# Patient Record
Sex: Female | Born: 1965 | Race: White | Hispanic: No | Marital: Married | Smoking: Never smoker
Health system: Southern US, Community
[De-identification: ages and names within clinical notes are randomized; demographics above are authoritative.]

## PROBLEM LIST (undated history)

## (undated) DIAGNOSIS — F419 Anxiety disorder, unspecified: Secondary | ICD-10-CM

## (undated) DIAGNOSIS — B009 Herpesviral infection, unspecified: Secondary | ICD-10-CM

## (undated) DIAGNOSIS — J302 Other seasonal allergic rhinitis: Secondary | ICD-10-CM

## (undated) DIAGNOSIS — A609 Anogenital herpesviral infection, unspecified: Secondary | ICD-10-CM

## (undated) DIAGNOSIS — E669 Obesity, unspecified: Secondary | ICD-10-CM

## (undated) HISTORY — PX: NASAL SEPTUM SURGERY: SHX37

## (undated) HISTORY — PX: CHOLECYSTECTOMY: SHX55

## (undated) HISTORY — PX: HAMMER TOE SURGERY: SHX385

## (undated) HISTORY — DX: Obesity, unspecified: E66.9

---

## 1999-02-22 ENCOUNTER — Other Ambulatory Visit: Admission: RE | Admit: 1999-02-22 | Discharge: 1999-02-22 | Payer: Self-pay | Admitting: Obstetrics and Gynecology

## 1999-03-30 ENCOUNTER — Other Ambulatory Visit: Admission: RE | Admit: 1999-03-30 | Discharge: 1999-03-30 | Payer: Self-pay | Admitting: *Deleted

## 1999-04-13 ENCOUNTER — Other Ambulatory Visit: Admission: RE | Admit: 1999-04-13 | Discharge: 1999-04-13 | Payer: Self-pay | Admitting: *Deleted

## 1999-12-13 ENCOUNTER — Other Ambulatory Visit: Admission: RE | Admit: 1999-12-13 | Discharge: 1999-12-13 | Payer: Self-pay | Admitting: *Deleted

## 2001-04-30 ENCOUNTER — Other Ambulatory Visit: Admission: RE | Admit: 2001-04-30 | Discharge: 2001-04-30 | Payer: Self-pay | Admitting: *Deleted

## 2012-02-13 ENCOUNTER — Observation Stay (HOSPITAL_BASED_OUTPATIENT_CLINIC_OR_DEPARTMENT_OTHER)
Admission: EM | Admit: 2012-02-13 | Discharge: 2012-02-14 | Disposition: A | Discharge status: Home / Self Care | Payer: BC Managed Care – PPO | Attending: Internal Medicine | Admitting: Internal Medicine

## 2012-02-13 ENCOUNTER — Other Ambulatory Visit: Payer: Self-pay

## 2012-02-13 ENCOUNTER — Encounter (HOSPITAL_BASED_OUTPATIENT_CLINIC_OR_DEPARTMENT_OTHER): Payer: Self-pay | Admitting: *Deleted

## 2012-02-13 ENCOUNTER — Emergency Department (INDEPENDENT_AMBULATORY_CARE_PROVIDER_SITE_OTHER): Payer: BC Managed Care – PPO

## 2012-02-13 DIAGNOSIS — G479 Sleep disorder, unspecified: Secondary | ICD-10-CM | POA: Insufficient documentation

## 2012-02-13 DIAGNOSIS — J302 Other seasonal allergic rhinitis: Secondary | ICD-10-CM

## 2012-02-13 DIAGNOSIS — R197 Diarrhea, unspecified: Secondary | ICD-10-CM | POA: Diagnosis present

## 2012-02-13 DIAGNOSIS — A609 Anogenital herpesviral infection, unspecified: Secondary | ICD-10-CM

## 2012-02-13 DIAGNOSIS — R079 Chest pain, unspecified: Principal | ICD-10-CM

## 2012-02-13 DIAGNOSIS — F419 Anxiety disorder, unspecified: Secondary | ICD-10-CM | POA: Diagnosis present

## 2012-02-13 DIAGNOSIS — R110 Nausea: Secondary | ICD-10-CM | POA: Insufficient documentation

## 2012-02-13 DIAGNOSIS — F411 Generalized anxiety disorder: Secondary | ICD-10-CM | POA: Insufficient documentation

## 2012-02-13 DIAGNOSIS — R61 Generalized hyperhidrosis: Secondary | ICD-10-CM | POA: Insufficient documentation

## 2012-02-13 DIAGNOSIS — D72829 Elevated white blood cell count, unspecified: Secondary | ICD-10-CM | POA: Diagnosis present

## 2012-02-13 DIAGNOSIS — R9431 Abnormal electrocardiogram [ECG] [EKG]: Secondary | ICD-10-CM

## 2012-02-13 DIAGNOSIS — J9819 Other pulmonary collapse: Secondary | ICD-10-CM

## 2012-02-13 HISTORY — DX: Anogenital herpesviral infection, unspecified: A60.9

## 2012-02-13 HISTORY — DX: Herpesviral infection, unspecified: B00.9

## 2012-02-13 HISTORY — DX: Other seasonal allergic rhinitis: J30.2

## 2012-02-13 HISTORY — DX: Anxiety disorder, unspecified: F41.9

## 2012-02-13 LAB — CBC
HCT: 39.8 % (ref 36.0–46.0)
Hemoglobin: 15.3 g/dL — ABNORMAL HIGH (ref 12.0–15.0)
Hemoglobin: 14.1 g/dL (ref 12.0–15.0)
MCH: 33.1 pg (ref 26.0–34.0)
MCHC: 35.4 g/dL (ref 30.0–36.0)
Platelets: 262 10*3/uL (ref 150–400)
RBC: 4.62 MIL/uL (ref 3.87–5.11)
RBC: 4.24 MIL/uL (ref 3.87–5.11)
WBC: 11.9 10*3/uL — ABNORMAL HIGH (ref 4.0–10.5)
WBC: 8.3 10*3/uL (ref 4.0–10.5)

## 2012-02-13 LAB — HEPATIC FUNCTION PANEL
AST: 20 U/L (ref 0–37)
Albumin: 3.4 g/dL — ABNORMAL LOW (ref 3.5–5.2)
Total Protein: 6.8 g/dL (ref 6.0–8.3)

## 2012-02-13 LAB — LIPASE, BLOOD: Lipase: 17 U/L (ref 11–59)

## 2012-02-13 LAB — BASIC METABOLIC PANEL
Chloride: 103 mEq/L (ref 96–112)
GFR calc Af Amer: >90 mL/min (ref >90)
GFR calc non Af Amer: >90 mL/min (ref >90)
Glucose, Bld: 143 mg/dL — ABNORMAL HIGH (ref 70–99)
Potassium: 3.9 mEq/L (ref 3.5–5.1)
Sodium: 138 mEq/L (ref 135–145)

## 2012-02-13 LAB — URINALYSIS, ROUTINE W REFLEX MICROSCOPIC
Ketones, ur: NEGATIVE mg/dL
Leukocytes, UA: NEGATIVE
Protein, ur: NEGATIVE mg/dL
Urobilinogen, UA: 1 mg/dL (ref 0.0–1.0)

## 2012-02-13 LAB — PROTIME-INR
INR: 0.99 (ref 0.00–1.49)
Prothrombin Time: 13.3 seconds (ref 11.6–15.2)

## 2012-02-13 LAB — DIFFERENTIAL
Eosinophils Absolute: 0.1 10*3/uL (ref 0.0–0.7)
Lymphs Abs: 0.6 10*3/uL — ABNORMAL LOW (ref 0.7–4.0)
Neutro Abs: 10.4 10*3/uL — ABNORMAL HIGH (ref 1.7–7.7)
Neutrophils Relative %: 87 % — ABNORMAL HIGH (ref 43–77)

## 2012-02-13 LAB — APTT: aPTT: 27 seconds (ref 24–37)

## 2012-02-13 LAB — CARDIAC PANEL(CRET KIN+CKTOT+MB+TROPI)
CK, MB: 1.4 ng/mL (ref 0.3–4.0)
CK, MB: 1.2 ng/mL (ref 0.3–4.0)
Relative Index: INVALID (ref 0.0–2.5)
Total CK: 32 U/L (ref 7–177)
Troponin I: <0.3 ng/mL (ref <0.30)
Troponin I: <0.3 ng/mL (ref <0.30)

## 2012-02-13 LAB — URINE MICROSCOPIC-ADD ON

## 2012-02-13 LAB — CREATININE, SERUM
GFR calc Af Amer: >90 mL/min (ref >90)
GFR calc non Af Amer: >90 mL/min (ref >90)

## 2012-02-13 LAB — TSH: TSH: 2.006 u[IU]/mL (ref 0.350–4.500)

## 2012-02-13 MED ORDER — NITROGLYCERIN 0.3 MG SL SUBL
0.3000 mg | SUBLINGUAL_TABLET | SUBLINGUAL | Status: DC | PRN
  Filled 2012-02-13: qty 100

## 2012-02-13 MED ORDER — SODIUM CHLORIDE 0.9 % IJ SOLN: 3.0000 mL | INTRAMUSCULAR | Status: DC | PRN

## 2012-02-13 MED ORDER — ENOXAPARIN SODIUM 40 MG/0.4ML ~~LOC~~ SOLN
40.0000 mg | SUBCUTANEOUS | Status: DC
  Administered 2012-02-13: 40 mg via SUBCUTANEOUS
  Filled 2012-02-13 (×2): qty 0.4

## 2012-02-13 MED ORDER — SODIUM CHLORIDE 0.9 % IJ SOLN
3.0000 mL | Freq: Two times a day (BID) | INTRAMUSCULAR | Status: DC
  Administered 2012-02-14: 3 mL via INTRAVENOUS

## 2012-02-13 MED ORDER — POLYVINYL ALCOHOL 1.4 % OP SOLN
1.0000 [drp] | Freq: Every day | OPHTHALMIC | Status: DC | PRN
  Filled 2012-02-13: qty 15

## 2012-02-13 MED ORDER — METOPROLOL TARTRATE 12.5 MG HALF TABLET
12.5000 mg | ORAL_TABLET | Freq: Two times a day (BID) | ORAL | Status: DC
  Administered 2012-02-13 – 2012-02-14 (×3): 12.5 mg via ORAL
  Filled 2012-02-13 (×4): qty 1

## 2012-02-13 MED ORDER — LORAZEPAM 1 MG PO TABS
1.0000 mg | ORAL_TABLET | Freq: Once | ORAL | Status: AC
  Administered 2012-02-13: 1 mg via ORAL
  Filled 2012-02-13: qty 1

## 2012-02-13 MED ORDER — SODIUM CHLORIDE 0.9 % IJ SOLN
3.0000 mL | Freq: Two times a day (BID) | INTRAMUSCULAR | Status: DC
  Administered 2012-02-13: 3 mL via INTRAVENOUS

## 2012-02-13 MED ORDER — ASPIRIN EC 325 MG PO TBEC
325.0000 mg | DELAYED_RELEASE_TABLET | Freq: Every day | ORAL | Status: DC
  Administered 2012-02-14: 325 mg via ORAL
  Filled 2012-02-13 (×2): qty 1

## 2012-02-13 MED ORDER — ALUM & MAG HYDROXIDE-SIMETH 200-200-20 MG/5ML PO SUSP: 30.0000 mL | Freq: Four times a day (QID) | ORAL | Status: DC | PRN

## 2012-02-13 MED ORDER — ACETAMINOPHEN 325 MG PO TABS
650.0000 mg | ORAL_TABLET | Freq: Four times a day (QID) | ORAL | Status: DC | PRN
  Administered 2012-02-14: 650 mg via ORAL
  Filled 2012-02-13: qty 2

## 2012-02-13 MED ORDER — LORAZEPAM 1 MG PO TABS: 1.0000 mg | ORAL_TABLET | Freq: Three times a day (TID) | ORAL | Status: DC | PRN

## 2012-02-13 MED ORDER — ASPIRIN 81 MG PO CHEW
324.0000 mg | CHEWABLE_TABLET | Freq: Once | ORAL | Status: AC
  Administered 2012-02-13: 324 mg via ORAL
  Filled 2012-02-13: qty 4

## 2012-02-13 MED ORDER — LORATADINE 10 MG PO TABS
10.0000 mg | ORAL_TABLET | Freq: Every day | ORAL | Status: DC
  Administered 2012-02-13 – 2012-02-14 (×2): 10 mg via ORAL
  Filled 2012-02-13 (×2): qty 1

## 2012-02-13 MED ORDER — ONDANSETRON HCL 4 MG/2ML IJ SOLN: 4.0000 mg | Freq: Four times a day (QID) | INTRAMUSCULAR | Status: DC | PRN

## 2012-02-13 MED ORDER — ONDANSETRON HCL 4 MG PO TABS
4.0000 mg | ORAL_TABLET | Freq: Four times a day (QID) | ORAL | Status: DC | PRN
  Administered 2012-02-13: 4 mg via ORAL
  Filled 2012-02-13: qty 1

## 2012-02-13 MED ORDER — OXYCODONE HCL 5 MG PO TABS: 5.0000 mg | ORAL_TABLET | ORAL | Status: DC | PRN

## 2012-02-13 MED ORDER — ONDANSETRON HCL 4 MG/2ML IJ SOLN
INTRAMUSCULAR | Status: AC
  Administered 2012-02-13: 4 mg
  Filled 2012-02-13: qty 2

## 2012-02-13 MED ORDER — ACETAMINOPHEN 650 MG RE SUPP: 650.0000 mg | Freq: Four times a day (QID) | RECTAL | Status: DC | PRN

## 2012-02-13 MED ORDER — POLYETHYL GLYCOL-PROPYL GLYCOL 0.4-0.3 % OP SOLN: Freq: Every day | OPHTHALMIC | Status: DC | PRN

## 2012-02-13 MED ORDER — PANTOPRAZOLE SODIUM 40 MG PO TBEC
40.0000 mg | DELAYED_RELEASE_TABLET | Freq: Every day | ORAL | Status: DC
  Administered 2012-02-13 – 2012-02-14 (×2): 40 mg via ORAL
  Filled 2012-02-13 (×2): qty 1

## 2012-02-13 MED ORDER — SODIUM CHLORIDE 0.9 % IV SOLN: 250.0000 mL | INTRAVENOUS | Status: DC | PRN

## 2012-02-13 MED ORDER — GI COCKTAIL ~~LOC~~
30.0000 mL | Freq: Three times a day (TID) | ORAL | Status: DC | PRN
  Filled 2012-02-13: qty 30

## 2012-02-13 MED ORDER — ACYCLOVIR 400 MG PO TABS
400.0000 mg | ORAL_TABLET | ORAL | Status: DC
  Filled 2012-02-13 (×9): qty 1

## 2012-02-13 MED ORDER — FLUTICASONE PROPIONATE 50 MCG/ACT NA SUSP
2.0000 | Freq: Every day | NASAL | Status: DC
  Administered 2012-02-14: 2 via NASAL
  Filled 2012-02-13: qty 16

## 2012-02-13 NOTE — ED Notes (Signed)
Admission bed received, pt informed.

## 2012-02-13 NOTE — ED Notes (Signed)
Previous weight documented incorrectly.  Pt weighs 239 lb.

## 2012-02-13 NOTE — H&P (Signed)
Pamela Foley MRN: [id number] DOB/AGE: 45-11-1965 45 y.o. Primary Care Physician:AGUIAR,RAFAELA M., MD, MD Admit date: 02/13/2012 Chief Complaint: Chest pain/panic attacks. HPI:  Pamela Foley is a 46 year old occasional female history of anxiety, genital itches, status post cholecystectomy who presented to the metastases point will she'll chest pain and panic attacks. Patient states that one day prior to admission she had 3 panic attacks that they. Patient does endorse some diaphoresis and palpitations. Patient subsequently did have some midsternal chest pain which he describes as a bowel in nature with some epigastric and left shoulder pain. Patient stated that adjustment of the bowel sensation is nonradiating with some associated palpitations increased anxiety and nausea and weakness. Patient denies any shortness of breath he denies any proximal dyspnea patient denies any fever no chills no constipation no other associated symptoms. Patient does endorse some mild diarrhea for the night prior to admission and states that 2 weeks prior to admission had been on antibiotics for a sinus infection. Patient was seen in the ED had metastases [place] EKG which was done did show some Q waves. Urine pregnancy study which was done was negative. CBC obtained had a white count of 11.9 otherwise was unremarkable. Basic metabolic profile which was done was unremarkable except a glucose of 143. EKG which was done did have some Q waves. Patient was given some aspirin as well as some nylon in the ED for. We were called to admit the patient for further evaluation and management.  Past Medical History  Diagnosis Date  . Herpes   . Anxiety 02/13/2012  . Seasonal allergies 02/13/2012  . HSV (herpes simplex virus) anogenital infection 02/13/2012    Past Surgical History  Procedure Date  . Cholecystectomy   . Nasal septum surgery   . Hammer toe surgery     Prior to Admission medications   Medication Sig  Start Date End Date Taking? Authorizing Provider  acyclovir (ZOVIRAX) 400 MG tablet Take 400 mg by mouth every 4 (four) hours while awake.   Yes Historical Provider, MD  ALPRAZolam Prudy Feeler) 0.5 MG tablet Take 0.5 mg by mouth 3 (three) times daily as needed. For anxiety   Yes Historical Provider, MD  cetirizine (ZYRTEC) 10 MG tablet Take 10 mg by mouth daily.   Yes Historical Provider, MD  fluticasone (FLONASE) 50 MCG/ACT nasal spray Place 2 sprays into the nose daily.   Yes Historical Provider, MD  ibuprofen (ADVIL,MOTRIN) 200 MG tablet Take 200 mg by mouth every 6 (six) hours as needed. For headache   Yes Historical Provider, MD  Polyethyl Glycol-Propyl Glycol (SYSTANE OP) Apply 1 drop to eye daily as needed. For dry eyes   Yes Historical Provider, MD    Allergies:  Allergies  Allergen Reactions  . Morphine And Related Nausea And Vomiting and Nausea Only    Family History  Problem Relation Age of Onset  . Heart attack Father     Social History:  reports that she has never smoked. She does not have any smokeless tobacco history on file. She reports that she drinks alcohol. She reports that she does not use illicit drugs.  ROS: All systems reviewed with the patient and was positive as per HPI otherwise all other systems are negative.  PHYSICAL EXAM: Blood pressure 141/86, pulse 85, temperature 98 F (36.7 C), temperature source Oral, resp. rate 20, height 5\' 3"  (1.6 m), weight 108.5 kg (239 lb 3.2 oz), last menstrual period 01/09/2012, SpO2 96.00%. General: Well-developed well-nourished in no acute  cardiopulmonary distress.  HEENT: Normocephalic atraumatic. Pupils equal regular reactive to light and accommodation. Extraocular movements intact. Oropharynx is clear, no lesions no exudates. Neck is supple with no lymphadenopathy. No bruits, no goiter. Heart: Regular rate and rhythm, without murmurs, rubs, gallops. Lungs: Clear to auscultation bilaterally. Abdomen: Soft, nontender,  nondistended, positive bowel sounds. Extremities: No clubbing cyanosis or edema with positive pedal pulses. Neuro: Alert and oriented x3. Creatinine is 2 through 12 are grossly intact. Sensation is intact. Power 5 bilateral upper extremity strength. However 5 bilateral lower extremity strength. Visual fields are intact. Gait not tested secondary to safety.   EKG: Q waves in leads 1 and 2.  No results found for this or any previous visit (from the past 240 hour(s)).   Lab results:  [facility] 02/13/12 0619  NA 138  K 3.9  CL 103  CO2 24  GLUCOSE 143*  BUN 13  CREATININE 0.70  CALCIUM 9.1  MG --  PHOS --   No results found for this basename: AST:2,ALT:2,ALKPHOS:2,BILITOT:2,PROT:2,ALBUMIN:2 in the last 72 hours No results found for this basename: LIPASE:2,AMYLASE:2 in the last 72 hours  Basename 02/13/12 0619  WBC 11.9*  NEUTROABS 10.4*  HGB 15.3*  HCT 43.0  MCV 93.1  PLT 262    Basename 02/13/12 0619  CKTOTAL 40  CKMB 1.4  CKMBINDEX --  TROPONINI <0.30   No components found with this basename: POCBNP:3 No results found for this basename: DDIMER in the last 72 hours No results found for this basename: HGBA1C:2 in the last 72 hours No results found for this basename: CHOL:2,HDL:2,LDLCALC:2,TRIG:2,CHOLHDL:2,LDLDIRECT:2 in the last 72 hours No results found for this basename: TSH,T4TOTAL,FREET3,T3FREE,THYROIDAB in the last 72 hours No results found for this basename: VITAMINB12:2,FOLATE:2,FERRITIN:2,TIBC:2,IRON:2,RETICCTPCT:2 in the last 72 hours Imaging results:  Dg Chest 2 View  02/13/2012  *RADIOLOGY REPORT*  Clinical Data: Anxiety.  CHEST - 2 VIEW  Comparison: None.  Findings: The lungs are well-aerated.  Minimal right basilar atelectasis is noted.  There is no evidence of pleural effusion or pneumothorax.  The heart is normal in size; the mediastinal contour is within normal limits.  No acute osseous abnormalities are seen.  IMPRESSION: Minimal right basilar atelectasis;  lungs otherwise clear.  Original Report Authenticated By: Tonia Ghent, M.D.   Impression/Plan:  Principal Problem:  *Chest pain Active Problems:  Anxiety  Seasonal allergies  HSV (herpes simplex virus) anogenital infection  Diarrhea  Leukocytosis   #1 chest pain Patient does have a family history of premature coronary artery disease when she states her father died at age 16s from the massive MI otherwise no other significant risk factors. Patient's chest pain started around the time she was having panic attacks. Will admit the patient to telemetry for observation. We'll cycle cardiac enzymes every 8 hours x3. Will check a fasting lipid panel, TSH, lipase. Will check a 2-D echo. We'll place on aspirin, oxygen, nitroglycerin as needed, low dose beta blocker, PPI and a GI cocktail as needed, Ativan when necessary.Maryclare Labrador need to get old EKG from patient's PCPs office.  #2 anxiety Patient is likely anxious. See problem #1. We'll place on Ativan as needed.  #3 seasonal allergies Claritin.  #4 HSV Resume home dose acyclovir.  #5 leukocytosis  chest x-ray is negative. Will check a UA with cultures and sensitivities. On the phone about a such this time until a source is identified. Follow H&H.  #6 diarrhea Questionable etiology. Patient was recently hospitalized in February of 2013. Will check stool for C.  difficile PCR. Will check stool for ova and parasites.  #7 prophylaxis PPI for GI prophylaxis, Lovenox for DVT prophylaxis.  Wadsworth Skolnick 02/13/2012, 11:45 AM

## 2012-02-13 NOTE — ED Notes (Signed)
Patient states that she has only had "6 hours of sleep in the past 2 nights". She states that heart problems run in her family and she has been having a panic attack all night. Now c/o her chest "being sore", but denies any coughing, vomiting, strenuous activity, ect. Also denies having any active chest pain.

## 2012-02-13 NOTE — Progress Notes (Signed)
02/13/2012 [facility], Bosie Clos [facility] Case Management Note [phone]    CARE MANAGEMENT NOTE 02/13/2012  Patient:  Pamela Foley, Pamela Foley   Account Number:  [id number]  Date Initiated:  02/13/2012  Documentation initiated by:  Fransico Michael  Subjective/Objective Assessment:   admitted on 02/13/12 with c/o chest pain and panic attacks     Action/Plan:   prior to admission, patient lived at home with spouse. Independent with ADLs   Anticipated DC Date:  02/16/2012   Anticipated DC Plan:  HOME/SELF CARE      DC Planning Services  CM consult      Choice offered to / List presented to:             Status of service:  In process, will continue to follow Medicare Important Message given?   (If response is "NO", the following Medicare IM given date fields will be blank) Date Medicare IM given:   Date Additional Medicare IM given:    Discharge Disposition:    Per UR Regulation:  Reviewed for med. necessity/level of care/duration of stay  If discussed at Long Length of Stay Meetings, dates discussed:    Comments:  PCP: Angelica Chessman.  Contact: Yinger,ROBERT (spouse) [phone]

## 2012-02-13 NOTE — ED Notes (Signed)
MD at bedside speaking with pt related to plan of care and admission.

## 2012-02-13 NOTE — ED Notes (Signed)
Pt also with diaphoresis. Denies SOB

## 2012-02-13 NOTE — ED Notes (Signed)
MD speaking with consult.

## 2012-02-13 NOTE — ED Notes (Signed)
EMTALA consent obtained from pt and reports called to [facility] Tresa Res, Charity fundraiser)

## 2012-02-13 NOTE — ED Provider Notes (Signed)
History     CSN: [id number]  Arrival date & time 02/13/12  0549   None     Chief Complaint  Patient presents with  . Sore    (Consider location/radiation/quality/duration/timing/severity/associated sxs/prior treatment) Patient is a 46 y.o. female presenting with anxiety and chest pain. The history is provided by the patient. No language interpreter was used.  Anxiety This is a recurrent problem. The current episode started 2 days ago. The problem occurs constantly. The problem has not changed since onset.Associated symptoms include chest pain. Pertinent negatives include no abdominal pain, no headaches and no shortness of breath. Associated symptoms comments: Sweaty palms unable to sleep x 2 days and  No SOB, no DOE. Some nausea and diaphoresis. The symptoms are aggravated by nothing. The symptoms are relieved by nothing. Treatments tried: .5 mg xanax. The treatment provided no relief.  Chest Pain The chest pain began 6 - 12 hours ago. Duration of episode(s) is 10 hours. Chest pain occurs constantly. The chest pain is unchanged. Associated with: none. Quality: soreness of the entire chest wall and epigastrum. The pain does not radiate. Primary symptoms include nausea. Pertinent negatives for primary symptoms include no fatigue, no syncope, no shortness of breath, no cough, no wheezing, no palpitations, no abdominal pain, no vomiting and no dizziness.  Associated symptoms include diaphoresis.  Pertinent negatives for associated symptoms include no claudication, no lower extremity edema, no near-syncope and no weakness. She tried nothing for the symptoms.  Pertinent negatives for past medical history include no Marfan's syndrome.  Her family medical history is significant for CAD in family.  Pertinent negatives for family medical history include: no Marfan's syndrome in family.  Procedure history is negative for cardiac catheterization.   She has had only < 6 hours of sleep in 2 days related  to panic attacks and having grandchild in the home.  She is feeling anxious and panicked related to financial situation and job she doesn't like.  Had "chest soreness" x > 8hours in duration and became more panicked with that as a part of her panic attack and decided to come in because she couldn't rest and xanax wasn't working.  PERC negative.  No swelling of the lower extremities.  No long car trips or plane trips.   History reviewed. No pertinent past medical history.  Past Surgical History  Procedure Date  . Cholecystectomy   . Nasal septum surgery     No family history on file.  History  Substance Use Topics  . Smoking status: Never Smoker   . Smokeless tobacco: Not on file  . Alcohol Use:     OB History    Grav Para Term Preterm Abortions TAB SAB Ect Mult Living                  Review of Systems  Constitutional: Positive for diaphoresis. Negative for fatigue.  HENT: Negative.   Respiratory: Negative for cough, chest tightness, shortness of breath and wheezing.   Cardiovascular: Positive for chest pain. Negative for palpitations, claudication, leg swelling, syncope and near-syncope.  Gastrointestinal: Positive for nausea. Negative for vomiting and abdominal pain.  Genitourinary: Negative.   Musculoskeletal: Negative.   Neurological: Negative for dizziness, weakness and headaches.  Hematological: Negative.   Psychiatric/Behavioral: Negative for suicidal ideas, hallucinations, behavioral problems, confusion, self-injury and agitation. The patient is nervous/anxious.     Allergies  Morphine and related  Home Medications  No current outpatient prescriptions on file.  BP 152/94  Pulse 115  Temp(Src) 98.1 F (36.7 C) (Oral)  Resp 20  SpO2 99%  Physical Exam  Constitutional: She is oriented to person, place, and time. She appears well-developed and well-nourished.  HENT:  Head: Normocephalic and atraumatic.  Mouth/Throat: Oropharynx is clear and moist. No  oropharyngeal exudate.  Eyes: Conjunctivae are normal. Pupils are equal, round, and reactive to light.  Neck: Normal range of motion. Neck supple.  Cardiovascular: Normal rate and regular rhythm.   Pulmonary/Chest: Effort normal and breath sounds normal.  Abdominal: Soft. Bowel sounds are normal. There is no tenderness. There is no rebound and no guarding.  Musculoskeletal: Normal range of motion. She exhibits no tenderness.  Neurological: She is alert and oriented to person, place, and time. She has normal reflexes.  Skin: Skin is warm and dry.  Psychiatric: Her mood appears anxious. She expresses no homicidal and no suicidal ideation. She expresses no suicidal plans.    ED Course  Procedures (including critical care time)   Labs Reviewed  CBC  DIFFERENTIAL  BASIC METABOLIC PANEL  CARDIAC PANEL(CRET KIN+CKTOT+MB+TROPI)  PREGNANCY, URINE   No results found.   No diagnosis found.    MDM   Date: 02/13/2012  Rate101  Rhythm: sinus tachycardia  QRS Axis: normal  Intervals: normal  ST/T Wave abnormalities: normal  Conduction Disutrbances:none  Narrative Interpretation: septal Q waves  Old EKG Reviewed: none available  Plan admit for rule out        Jojuan Champney Smitty Cords, MD 02/13/12 [phone]

## 2012-02-13 NOTE — ED Notes (Signed)
[facility] at bedside for transport.  Pt stable upon transfer.

## 2012-02-13 NOTE — Progress Notes (Signed)
Utilization review completed.  

## 2012-02-14 LAB — LIPID PANEL
HDL: 41 mg/dL (ref >39)
LDL Cholesterol: 107 mg/dL — ABNORMAL HIGH (ref 0–99)
Total CHOL/HDL Ratio: 4.1 RATIO
VLDL: 22 mg/dL (ref 0–40)

## 2012-02-14 LAB — CARDIAC PANEL(CRET KIN+CKTOT+MB+TROPI)
Relative Index: INVALID (ref 0.0–2.5)
Total CK: 28 U/L (ref 7–177)

## 2012-02-14 LAB — BASIC METABOLIC PANEL
CO2: 26 mEq/L (ref 19–32)
Calcium: 8.7 mg/dL (ref 8.4–10.5)
Chloride: 104 mEq/L (ref 96–112)
Glucose, Bld: 106 mg/dL — ABNORMAL HIGH (ref 70–99)
Sodium: 138 mEq/L (ref 135–145)

## 2012-02-14 LAB — CBC
HCT: 38.4 % (ref 36.0–46.0)
MCH: 33.3 pg (ref 26.0–34.0)
MCV: 94.6 fL (ref 78.0–100.0)
Platelets: 215 10*3/uL (ref 150–400)
RBC: 4.06 MIL/uL (ref 3.87–5.11)
WBC: 5.2 10*3/uL (ref 4.0–10.5)

## 2012-02-14 LAB — HEMOGLOBIN A1C: Hgb A1c MFr Bld: 5.5 % (ref <5.7)

## 2012-02-14 MED ORDER — LORAZEPAM 1 MG PO TABS: 1.0000 mg | ORAL_TABLET | Freq: Three times a day (TID) | ORAL | Status: AC | PRN

## 2012-02-14 NOTE — Discharge Instructions (Signed)

## 2012-02-14 NOTE — Discharge Summary (Signed)
Discharge Summary  Pamela Foley MR#: [id number]  DOB:04-25-1966  Date of Admission: 02/13/2012 Date of Discharge: 02/14/2012  Patient's PCP: Angelica Chessman., MD, MD  Attending Physician:Edynn Gillock  Consults:   None.  Discharge Diagnoses: Chest pain Present on Admission:  .Anxiety .Seasonal allergies .HSV (herpes simplex virus) anogenital infection .Diarrhea .Leukocytosis   Brief Admitting History and Physical Pamela Foley is a 46 year old occasional female history of anxiety, genital itches, status post cholecystectomy who [facility] will she'll chest pain and panic attacks. Patient states that one day prior to admission she had 3 panic attacks that they. Patient does endorse some diaphoresis and palpitations. Patient subsequently did have some midsternal chest pain which he describes as a bowel in nature with some epigastric and left shoulder pain. Patient stated that adjustment of the bowel sensation is nonradiating with some associated palpitations increased anxiety and nausea and weakness. Patient denies any shortness of breath he denies any proximal dyspnea patient denies any fever no chills no constipation no other associated symptoms. Patient does endorse some mild diarrhea for the night prior to admission and states that 2 weeks prior to admission had been on antibiotics for a sinus infection. Patient was seen in the ED had [place] EKG which was done did show some Q waves. Urine pregnancy study which was done was negative. CBC obtained had a white count of 11.9 otherwise was unremarkable. Basic metabolic profile which was done was unremarkable except a glucose of 143. EKG which was done did have some Q waves. Patient was given some aspirin as well as some nylon in the ED for. We were called to admit the patient for further evaluation and management.    Discharge Medications Medication List  As of 02/14/2012  7:19 PM   START  taking these medications         LORazepam 1 MG tablet   Commonly known as: ATIVAN   Take 1 tablet (1 mg total) by mouth every 8 (eight) hours as needed for anxiety.         CONTINUE taking these medications         acyclovir 400 MG tablet   Commonly known as: ZOVIRAX      cetirizine 10 MG tablet   Commonly known as: ZYRTEC      fluticasone 50 MCG/ACT nasal spray   Commonly known as: FLONASE      ibuprofen 200 MG tablet   Commonly known as: ADVIL,MOTRIN      SYSTANE OP         STOP taking these medications         ALPRAZolam 0.5 MG tablet          Where to get your medications    These are the prescriptions that you need to pick up.   You may get these medications from any pharmacy.         LORazepam 1 MG tablet           Hospital Course: Chest pain Patient was transferred [facility] as she had presented in chest pain which occurred in the setting of panic attack. Patient did have a family history of premature coronary artery disease in her father who died in his 30s from a massive MI. Cardiac enzymes were cycled which were negative x3. A 2-D echo was obtained which showed a normal ejection fraction of 55-60% with no wall motion abnormalities. Patient improved clinically remained chest pain-free throughout the hospitalization.  Patient was initially maintained on aspirin and a beta blocker. Patient did not have any further panic attacks and no further chest pain. Patient be discharged home in stable and improved condition. Patient will followup with Dr. Anne Fu of [facility] cardiology as an outpatient for outpatient stress test. Patient will be called with an appointment time. Patient was discharged home in stable and improved condition.  Anxiety During the hospitalization the patient was maintained on Ativan as needed for anxiety. Patient's anxiety was controlled on Ativan and patient did not have any severe panic attacks. Patient will be discharged home on  Ativan on an as-needed basis and will followup with her PCP as outpatient.  The rest of patient's chronic  medical issues remained stable throughout the hospitalization it was a pleasure taking care of [name].  Present on Admission:  .Anxiety .Seasonal allergies .HSV (herpes simplex virus) anogenital infection .Diarrhea .Leukocytosis   Day of Discharge BP 127/79  Pulse 84  Temp(Src) 97.8 F (36.6 C) (Oral)  Resp 20  Ht 5\' 3"  (1.6 m)  Wt 107.457 kg (236 lb 14.4 oz)  BMI 41.96 kg/m2  SpO2 95%  LMP 01/09/2012 Subjective: No chest pain no shortness of breath no complaints wants to go home. General: Alert, awake, oriented x3, in no acute distress. Heart: Regular rate and rhythm, without murmurs, rubs, gallops. Lungs: Clear to auscultation bilaterally. Abdomen: Soft, nontender, nondistended, positive bowel sounds. Extremities: No clubbing cyanosis or edema with positive pedal pulse   Results for orders placed during the hospital encounter of 02/13/12 (from the past 48 hour(s))  CBC     Status: Abnormal   Collection Time   02/13/12  6:19 AM      Component Value Range Comment   WBC 11.9 (*) 4.0 - 10.5 (K/uL)    RBC 4.62  3.87 - 5.11 (MIL/uL)    Hemoglobin 15.3 (*) 12.0 - 15.0 (g/dL)    HCT 16.1  09.6 - 04.5 (%)    MCV 93.1  78.0 - 100.0 (fL)    MCH 33.1  26.0 - 34.0 (pg)    MCHC 35.6  30.0 - 36.0 (g/dL)    RDW 40.9  81.1 - 91.4 (%)    Platelets 262  150 - 400 (K/uL)   DIFFERENTIAL     Status: Abnormal   Collection Time   02/13/12  6:19 AM      Component Value Range Comment   Neutrophils Relative 87 (*) 43 - 77 (%)    Neutro Abs 10.4 (*) 1.7 - 7.7 (K/uL)    Lymphocytes Relative 5 (*) 12 - 46 (%)    Lymphs Abs 0.6 (*) 0.7 - 4.0 (K/uL)    Monocytes Relative 7  3 - 12 (%)    Monocytes Absolute 0.8  0.1 - 1.0 (K/uL)    Eosinophils Relative 1  0 - 5 (%)    Eosinophils Absolute 0.1  0.0 - 0.7 (K/uL)    Basophils Relative 0  0 - 1 (%)    Basophils Absolute 0.0  0.0  - 0.1 (K/uL)   BASIC METABOLIC PANEL     Status: Abnormal   Collection Time   02/13/12  6:19 AM      Component Value Range Comment   Sodium 138  135 - 145 (mEq/L)    Potassium 3.9  3.5 - 5.1 (mEq/L)    Chloride 103  96 - 112 (mEq/L)    CO2 24  19 - 32 (mEq/L)    Glucose, Bld 143 (*)  70 - 99 (mg/dL)    BUN 13  6 - 23 (mg/dL)    Creatinine, Ser 1.61  0.50 - 1.10 (mg/dL)    Calcium 9.1  8.4 - 10.5 (mg/dL)    GFR calc non Af Amer >90  >90 (mL/min)    GFR calc Af Amer >90  >90 (mL/min)   CARDIAC PANEL(CRET KIN+CKTOT+MB+TROPI)     Status: Normal   Collection Time   02/13/12  6:19 AM      Component Value Range Comment   Total CK 40  7 - 177 (U/L)    CK, MB 1.4  0.3 - 4.0 (ng/mL)    Troponin I <0.30  <0.30 (ng/mL)    Relative Index RELATIVE INDEX IS INVALID  0.0 - 2.5    PREGNANCY, URINE     Status: Normal   Collection Time   02/13/12  7:15 AM      Component Value Range Comment   Preg Test, Ur NEGATIVE  NEGATIVE    URINALYSIS, ROUTINE W REFLEX MICROSCOPIC     Status: Abnormal   Collection Time   02/13/12 12:25 PM      Component Value Range Comment   Color, Urine AMBER (*) YELLOW  BIOCHEMICALS MAY BE AFFECTED BY COLOR   APPearance TURBID (*) CLEAR     Specific Gravity, Urine 1.031 (*) 1.005 - 1.030     pH 6.0  5.0 - 8.0     Glucose, UA NEGATIVE  NEGATIVE (mg/dL)    Hgb urine dipstick NEGATIVE  NEGATIVE     Bilirubin Urine SMALL (*) NEGATIVE     Ketones, ur NEGATIVE  NEGATIVE (mg/dL)    Protein, ur NEGATIVE  NEGATIVE (mg/dL)    Urobilinogen, UA 1.0  0.0 - 1.0 (mg/dL)    Nitrite NEGATIVE  NEGATIVE     Leukocytes, UA NEGATIVE  NEGATIVE    URINE CULTURE     Status: Normal (Preliminary result)   Collection Time   02/13/12 12:25 PM      Component Value Range Comment   Specimen Description URINE, CLEAN CATCH      Special Requests NONE      Culture  Setup Time [id number]      Colony Count PENDING      Culture Culture reincubated for better growth      Report Status PENDING     URINE  MICROSCOPIC-ADD ON     Status: Normal   Collection Time   02/13/12 12:25 PM      Component Value Range Comment   Squamous Epithelial / LPF RARE  RARE     Bacteria, UA RARE  RARE     Urine-Other AMORPHOUS URATES/PHOSPHATES     CBC     Status: Normal   Collection Time   02/13/12 12:52 PM      Component Value Range Comment   WBC 8.3  4.0 - 10.5 (K/uL)    RBC 4.24  3.87 - 5.11 (MIL/uL)    Hemoglobin 14.1  12.0 - 15.0 (g/dL)    HCT 91.4  78.2 - 95.6 (%)    MCV 93.9  78.0 - 100.0 (fL)    MCH 33.3  26.0 - 34.0 (pg)    MCHC 35.4  30.0 - 36.0 (g/dL)    RDW 21.3  08.6 - 57.8 (%)    Platelets 239  150 - 400 (K/uL)   CREATININE, SERUM     Status: Normal   Collection Time   02/13/12 12:52 PM      Component  Value Range Comment   Creatinine, Ser 0.61  0.50 - 1.10 (mg/dL)    GFR calc non Af Amer >90  >90 (mL/min)    GFR calc Af Amer >90  >90 (mL/min)   HEPATIC FUNCTION PANEL     Status: Abnormal   Collection Time   02/13/12 12:52 PM      Component Value Range Comment   Total Protein 6.8  6.0 - 8.3 (g/dL)    Albumin 3.4 (*) 3.5 - 5.2 (g/dL)    AST 20  0 - 37 (U/L)    ALT 20  0 - 35 (U/L)    Alkaline Phosphatase 74  39 - 117 (U/L)    Total Bilirubin 0.5  0.3 - 1.2 (mg/dL)    Bilirubin, Direct <1.6  0.0 - 0.3 (mg/dL)    Indirect Bilirubin NOT CALCULATED  0.3 - 0.9 (mg/dL)   MAGNESIUM     Status: Normal   Collection Time   02/13/12 12:52 PM      Component Value Range Comment   Magnesium 2.0  1.5 - 2.5 (mg/dL)   APTT     Status: Normal   Collection Time   02/13/12 12:52 PM      Component Value Range Comment   aPTT 27  24 - 37 (seconds)   PROTIME-INR     Status: Normal   Collection Time   02/13/12 12:52 PM      Component Value Range Comment   Prothrombin Time 13.3  11.6 - 15.2 (seconds)    INR 0.99  0.00 - 1.49    TSH     Status: Normal   Collection Time   02/13/12 12:52 PM      Component Value Range Comment   TSH 2.006  0.350 - 4.500 (uIU/mL)   HEMOGLOBIN A1C     Status: Normal    Collection Time   02/13/12 12:52 PM      Component Value Range Comment   Hemoglobin A1C 5.5  <5.7 (%)    Mean Plasma Glucose 111  <117 (mg/dL)   LIPASE, BLOOD     Status: Normal   Collection Time   02/13/12 12:52 PM      Component Value Range Comment   Lipase 17  11 - 59 (U/L)   CARDIAC PANEL(CRET KIN+CKTOT+MB+TROPI)     Status: Normal   Collection Time   02/13/12  1:47 PM      Component Value Range Comment   Total CK 30  7 - 177 (U/L)    CK, MB 1.3  0.3 - 4.0 (ng/mL)    Troponin I <0.30  <0.30 (ng/mL)    Relative Index RELATIVE INDEX IS INVALID  0.0 - 2.5    CARDIAC PANEL(CRET KIN+CKTOT+MB+TROPI)     Status: Normal   Collection Time   02/13/12  7:53 PM      Component Value Range Comment   Total CK 32  7 - 177 (U/L)    CK, MB 1.2  0.3 - 4.0 (ng/mL)    Troponin I <0.30  <0.30 (ng/mL)    Relative Index RELATIVE INDEX IS INVALID  0.0 - 2.5    CARDIAC PANEL(CRET KIN+CKTOT+MB+TROPI)     Status: Normal   Collection Time   02/14/12  3:34 AM      Component Value Range Comment   Total CK 28  7 - 177 (U/L)    CK, MB 1.1  0.3 - 4.0 (ng/mL)    Troponin I <0.30  <0.30 (ng/mL)  Relative Index RELATIVE INDEX IS INVALID  0.0 - 2.5    BASIC METABOLIC PANEL     Status: Abnormal   Collection Time   02/14/12  3:34 AM      Component Value Range Comment   Sodium 138  135 - 145 (mEq/L)    Potassium 4.4  3.5 - 5.1 (mEq/L)    Chloride 104  96 - 112 (mEq/L)    CO2 26  19 - 32 (mEq/L)    Glucose, Bld 106 (*) 70 - 99 (mg/dL)    BUN 10  6 - 23 (mg/dL)    Creatinine, Ser 1.61  0.50 - 1.10 (mg/dL)    Calcium 8.7  8.4 - 10.5 (mg/dL)    GFR calc non Af Amer >90  >90 (mL/min)    GFR calc Af Amer >90  >90 (mL/min)   CBC     Status: Normal   Collection Time   02/14/12  3:34 AM      Component Value Range Comment   WBC 5.2  4.0 - 10.5 (K/uL)    RBC 4.06  3.87 - 5.11 (MIL/uL)    Hemoglobin 13.5  12.0 - 15.0 (g/dL)    HCT 09.6  04.5 - 40.9 (%)    MCV 94.6  78.0 - 100.0 (fL)    MCH 33.3  26.0 - 34.0 (pg)     MCHC 35.2  30.0 - 36.0 (g/dL)    RDW 81.1  91.4 - 78.2 (%)    Platelets 215  150 - 400 (K/uL)   LIPID PANEL     Status: Abnormal   Collection Time   02/14/12  3:34 AM      Component Value Range Comment   Cholesterol 170  0 - 200 (mg/dL)    Triglycerides 956  <150 (mg/dL)    HDL 41  >21 (mg/dL)    Total CHOL/HDL Ratio 4.1      VLDL 22  0 - 40 (mg/dL)    LDL Cholesterol 308 (*) 0 - 99 (mg/dL)     Dg Chest 2 View  6/57/8469  *RADIOLOGY REPORT*  Clinical Data: Anxiety.  CHEST - 2 VIEW  Comparison: None.  Findings: The lungs are well-aerated.  Minimal right basilar atelectasis is noted.  There is no evidence of pleural effusion or pneumothorax.  The heart is normal in size; the mediastinal contour is within normal limits.  No acute osseous abnormalities are seen.  IMPRESSION: Minimal right basilar atelectasis; lungs otherwise clear.  Original Report Authenticated By: Tonia Ghent, M.D.     Disposition: Home  Diet: Regular  Activity: Increase activity slowly   Follow-up Appts: Discharge Orders    Future Orders Please Complete By Expires   Diet general      Increase activity slowly      Discharge instructions      Comments:   Follow up with Angelica Chessman., MD, in 1-2 weeks [facility], Will call you with appointment for outpatient stress test with Dr Anne Fu.       TESTS THAT NEED FOLLOW-UP Patient will be called for an appointment for outpatient stress test with Dr. Anne Fu of [facility] cardiology.  Time spent on discharge, talking to the patient, and coordinating care: 45 mins.   SignedRamiro Harvest 02/14/2012, 7:19 PM

## 2012-02-14 NOTE — Progress Notes (Signed)
  Echocardiogram 2D Echocardiogram has been performed.  Cathie Beams Deneen 02/14/2012, 9:33 AM

## 2012-02-16 LAB — URINE CULTURE

## 2013-07-04 IMAGING — CR DG CHEST 2V
2 series · 2 of 2 positions shown · non-contrast
Comparison: None.

CLINICAL DATA: Anxiety.

CHEST - 2 VIEW

[w chest pa]
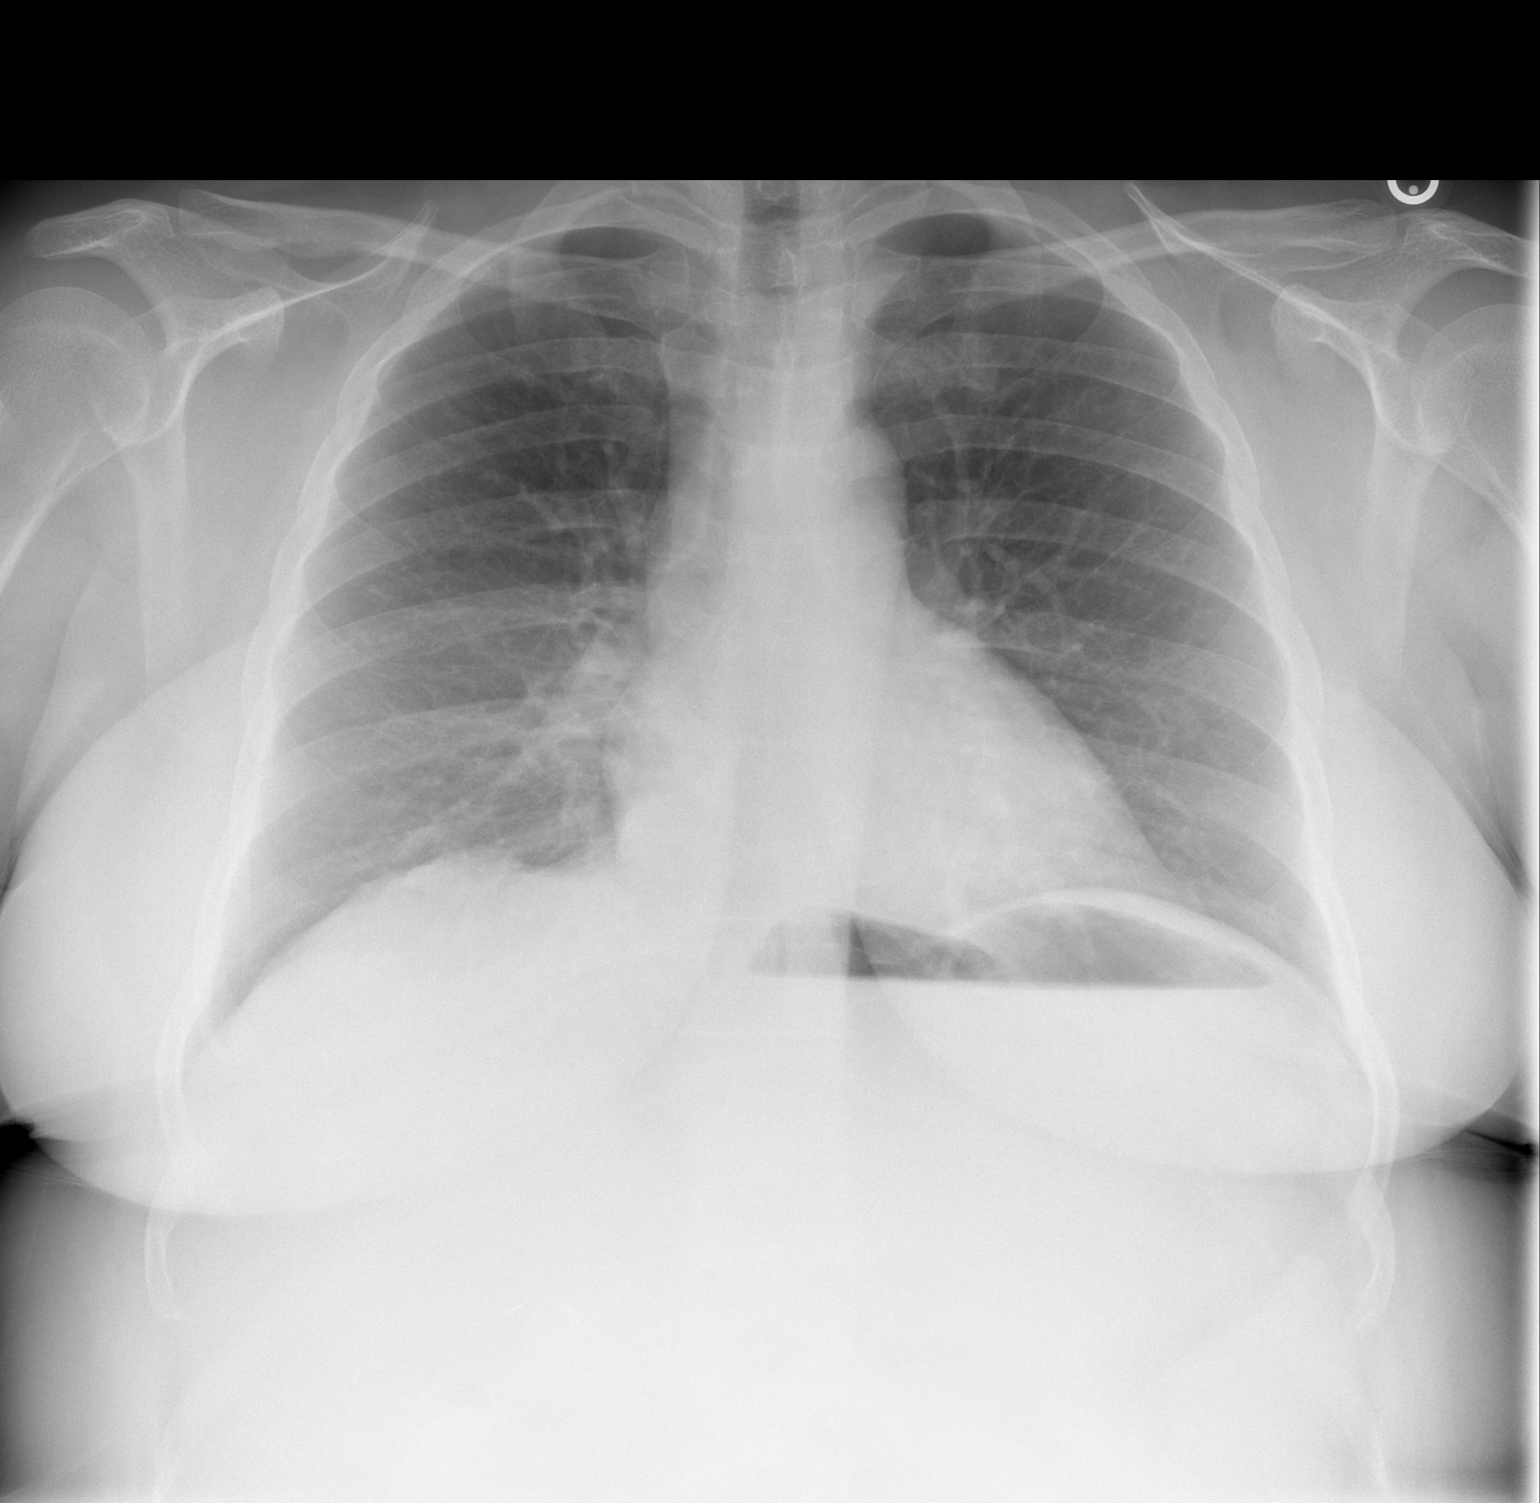

[w chest lat]
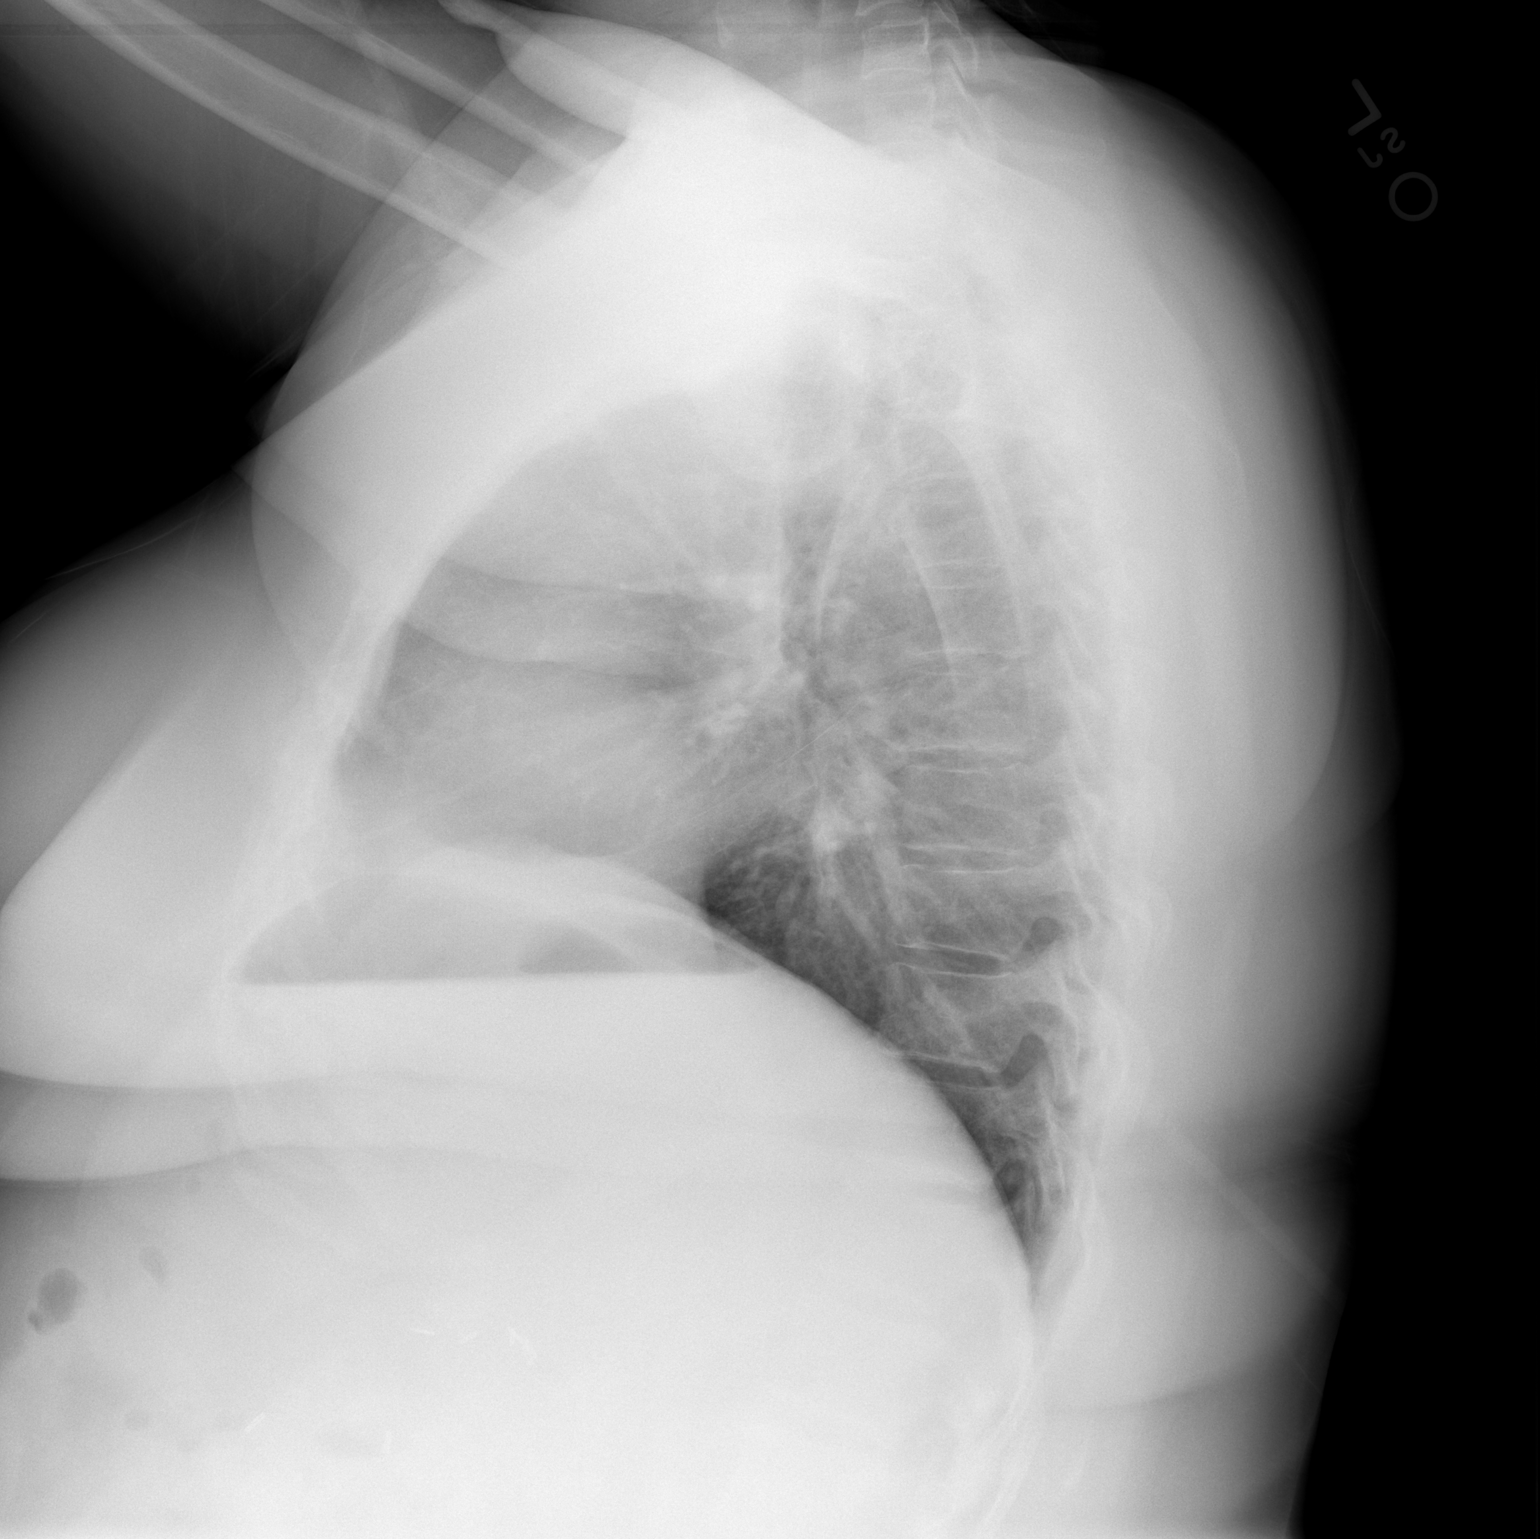

[2 of 2 positions shown; findings below may reference images not displayed]

FINDINGS: The lungs are well-aerated.  Minimal right basilar
atelectasis is noted.  There is no evidence of pleural effusion or
pneumothorax.

The heart is normal in size; the mediastinal contour is within
normal limits.  No acute osseous abnormalities are seen.
IMPRESSION: Minimal right basilar atelectasis; lungs otherwise clear.

## 2014-05-19 DIAGNOSIS — F32A Depression, unspecified: Secondary | ICD-10-CM | POA: Insufficient documentation

## 2014-05-19 DIAGNOSIS — G4700 Insomnia, unspecified: Secondary | ICD-10-CM | POA: Insufficient documentation

## 2014-06-30 DIAGNOSIS — K580 Irritable bowel syndrome with diarrhea: Secondary | ICD-10-CM | POA: Insufficient documentation

## 2014-12-15 ENCOUNTER — Ambulatory Visit: Payer: Self-pay | Admitting: Podiatry

## 2015-12-29 DIAGNOSIS — R0683 Snoring: Secondary | ICD-10-CM | POA: Insufficient documentation

## 2016-11-16 ENCOUNTER — Other Ambulatory Visit: Payer: Self-pay | Admitting: Obstetrics and Gynecology

## 2016-11-16 DIAGNOSIS — R928 Other abnormal and inconclusive findings on diagnostic imaging of breast: Secondary | ICD-10-CM

## 2016-11-25 ENCOUNTER — Ambulatory Visit
Admission: RE | Admit: 2016-11-25 | Discharge: 2016-11-25 | Disposition: A | Discharge status: Home / Self Care | Payer: BLUE CROSS/BLUE SHIELD | Source: Ambulatory Visit | Attending: Obstetrics and Gynecology | Admitting: Obstetrics and Gynecology

## 2016-11-25 ENCOUNTER — Other Ambulatory Visit: Payer: Self-pay | Admitting: Obstetrics and Gynecology

## 2016-11-25 DIAGNOSIS — R928 Other abnormal and inconclusive findings on diagnostic imaging of breast: Secondary | ICD-10-CM

## 2016-11-25 DIAGNOSIS — N6320 Unspecified lump in the left breast, unspecified quadrant: Secondary | ICD-10-CM

## 2016-11-30 ENCOUNTER — Ambulatory Visit
Admission: RE | Admit: 2016-11-30 | Discharge: 2016-11-30 | Disposition: A | Discharge status: Home / Self Care | Payer: BLUE CROSS/BLUE SHIELD | Source: Ambulatory Visit | Attending: Obstetrics and Gynecology | Admitting: Obstetrics and Gynecology

## 2016-11-30 ENCOUNTER — Other Ambulatory Visit: Payer: Self-pay | Admitting: Obstetrics and Gynecology

## 2016-11-30 DIAGNOSIS — N6320 Unspecified lump in the left breast, unspecified quadrant: Secondary | ICD-10-CM

## 2017-05-08 DIAGNOSIS — K219 Gastro-esophageal reflux disease without esophagitis: Secondary | ICD-10-CM | POA: Insufficient documentation

## 2021-06-18 ENCOUNTER — Ambulatory Visit: Payer: BLUE CROSS/BLUE SHIELD | Admitting: Family

## 2021-12-22 ENCOUNTER — Other Ambulatory Visit: Payer: Self-pay

## 2021-12-22 ENCOUNTER — Encounter: Payer: Self-pay | Admitting: Adult Health

## 2021-12-22 ENCOUNTER — Ambulatory Visit (INDEPENDENT_AMBULATORY_CARE_PROVIDER_SITE_OTHER): Payer: 59 | Admitting: Adult Health

## 2021-12-22 VITALS — BP 158/74 | HR 64 | Ht 63.0 in | Wt 251.0 lb

## 2021-12-22 DIAGNOSIS — F411 Generalized anxiety disorder: Secondary | ICD-10-CM

## 2021-12-22 DIAGNOSIS — G4700 Insomnia, unspecified: Secondary | ICD-10-CM | POA: Diagnosis not present

## 2021-12-22 DIAGNOSIS — F331 Major depressive disorder, recurrent, moderate: Secondary | ICD-10-CM

## 2021-12-22 MED ORDER — CLONAZEPAM 0.5 MG PO TABS: 0.5000 mg | ORAL_TABLET | Freq: Two times a day (BID) | ORAL | 2 refills | Status: DC | PRN

## 2021-12-22 MED ORDER — SERTRALINE HCL 50 MG PO TABS: 50.0000 mg | ORAL_TABLET | Freq: Every day | ORAL | 3 refills | Status: DC

## 2021-12-22 NOTE — Progress Notes (Signed)
[facility] MD/PA/NP Initial Note  12/22/2021 12:04 PM Pamela Foley  MRN:  [id number]  Chief Complaint:   HPI:  Describes mood today as "ok". Pleasant. Reports tearfulness. Mood symptoms - reports depression and anxiety. Gets irritable at times - more work related. Reports being a "worrier" - more so in work setting. Prefers for things to be neat and clean. Stating "I'm not where I want to be". Currently taking Zoloft 50mg  daily and Clonazepam 0.5mg  at bedtime. Does not feel like current medications are working as well as they were, but is unsure she wants to make any changes. Working with a wellness provider to make - taking vitamin supplementation. Stable interest and motivation. Taking medications as prescribed.  Energy levels mostly stable. Active, has a regular exercise routine 3 days a week. Enjoys some usual interests and activities. Married. Lives with husband  x 20 years. Has 39 - 34 month old english bulldogs. Two children from another marriage. Spending time with family. Appetite adequate. Weight stable. Sleeps better some nights than others. Averages 6 to 7 hours. Using Clonazepam as needed. Focus and concentration stable. Completing tasks. Managing aspects of household. Works full-time - 5. Denies SI or HI.  Denies AH or VH.  Previous medication trials: Lunesta, Pristiq, Zoloft, Ambien, Clonazepam, Trazadone  Visit Diagnosis:    ICD-10-CM   1. Generalized anxiety disorder  F41.1     2. Major depressive disorder, recurrent episode, moderate (HCC)  F33.1     3. Insomnia, unspecified type  G47.00       Past Psychiatric History: Denies psychiatric hospitalization.   Past Medical History:  Past Medical History:  Diagnosis Date   Anxiety 02/13/2012   Herpes    HSV (herpes simplex virus) anogenital infection 02/13/2012   Obesity    Seasonal allergies 02/13/2012    Past Surgical History:  Procedure Laterality Date   CHOLECYSTECTOMY     HAMMER TOE SURGERY      NASAL SEPTUM SURGERY      Family Psychiatric History: Denies any family history of mental illness.   Family History:  Family History  Problem Relation Age of Onset   Heart attack Father     Social History:  Social History   Socioeconomic History   Marital status: Married    Spouse name: Not on file   Number of children: Not on file   Years of education: Not on file   Highest education level: Not on file  Occupational History   Not on file  Tobacco Use   Smoking status: Never   Smokeless tobacco: Not on file  Substance and Sexual Activity   Alcohol use: Yes    Comment: occassionally   Drug use: No   Sexual activity: Yes  Other Topics Concern   Not on file  Social History Narrative   Not on file   Social Determinants of Health   Financial Resource Strain: Not on file  Food Insecurity: Not on file  Transportation Needs: Not on file  Physical Activity: Not on file  Stress: Not on file  Social Connections: Not on file    Allergies:  Allergies  Allergen Reactions   Morphine Nausea And Vomiting and Nausea Only   Morphine And Related Nausea And Vomiting and Nausea Only   Desvenlafaxine Nausea Only    Made symptoms worse Made symptoms worse Made symptoms worse Made symptoms worse    Eszopiclone Nausea Only    Made symptoms worse Made symptoms worse Made symptoms worse Made symptoms worse  Moxifloxacin Nausea And Vomiting    Metabolic Disorder Labs: Lab Results  Component Value Date   HGBA1C 5.5 02/13/2012   MPG 111 02/13/2012   No results found for: PROLACTIN Lab Results  Component Value Date   CHOL 170 02/14/2012   TRIG 109 02/14/2012   HDL 41 02/14/2012   CHOLHDL 4.1 02/14/2012   VLDL 22 02/14/2012   LDLCALC 107 (H) 02/14/2012   Lab Results  Component Value Date   TSH 2.006 02/13/2012    Therapeutic Level Labs: No results found for: LITHIUM No results found for: VALPROATE No components found for:  CBMZ  Current  Medications: Current Outpatient Medications  Medication Sig Dispense Refill   acyclovir (ZOVIRAX) 400 MG tablet Take 400 mg by mouth every 4 (four) hours while awake.     cetirizine (ZYRTEC) 10 MG tablet Take 10 mg by mouth daily.     fluticasone (FLONASE) 50 MCG/ACT nasal spray Place 2 sprays into the nose daily.     ibuprofen (ADVIL,MOTRIN) 200 MG tablet Take 200 mg by mouth every 6 (six) hours as needed. For headache     Polyethyl Glycol-Propyl Glycol (SYSTANE OP) Apply 1 drop to eye daily as needed. For dry eyes     valACYclovir (VALTREX) 500 MG tablet Take 500 mg by mouth 2 (two) times daily.     No current facility-administered medications for this visit.    Medication Side Effects: none  Orders placed this visit:  No orders of the defined types were placed in this encounter.   Psychiatric Specialty Exam:  Review of Systems  Musculoskeletal:  Negative for gait problem.  Neurological:  Negative for tremors.  Psychiatric/Behavioral:         Please refer to HPI   There were no vitals taken for this visit.There is no height or weight on file to calculate BMI.  General Appearance: Casual and Neat  Eye Contact:  Good  Speech:  Clear and Coherent  Volume:  Normal  Mood:  Euthymic  Affect:  Appropriate and Congruent  Thought Process:  Coherent and Descriptions of Associations: Intact  Orientation:  Full (Time, Place, and Person)  Thought Content: Logical   Suicidal Thoughts:  No  Homicidal Thoughts:  No  Memory:  WNL  Judgement:  Good  Insight:  Good  Psychomotor Activity:  Normal  Concentration:  Concentration: Good  Recall:  Good  Fund of Knowledge: Good  Language: Good  Assets:  Communication Skills Desire for Improvement Financial Resources/Insurance Housing Intimacy Leisure Time Physical Health Resilience Social Support Talents/Skills Transportation Vocational/Educational  ADL's:  Intact  Cognition: WNL  Prognosis:  Good   Screenings: MDQ  Receiving  Psychotherapy: No   Treatment Plan/Recommendations:  Plan:  PDMP reviewed  Zoloft 50mg  daily. Clonazepam 0.5mg  twice daily as needed.  Discussed potential benefits, risk, and side effects of benzodiazepines to include potential risk of tolerance and dependence, as well as possible drowsiness.  Advised patient not to drive if experiencing drowsiness and to take lowest possible effective dose to minimize risk of dependence and tolerance.   RTC 4 weeks  Patient advised to contact office with any questions, adverse effects, or acute worsening in signs and symptoms.      , NP

## 2022-06-21 ENCOUNTER — Ambulatory Visit: Payer: 59 | Admitting: Adult Health

## 2023-07-26 ENCOUNTER — Other Ambulatory Visit: Payer: Self-pay | Admitting: Obstetrics and Gynecology

## 2023-07-26 DIAGNOSIS — N6322 Unspecified lump in the left breast, upper inner quadrant: Secondary | ICD-10-CM

## 2023-07-29 ENCOUNTER — Other Ambulatory Visit: Payer: Self-pay | Admitting: Obstetrics and Gynecology

## 2023-07-29 DIAGNOSIS — N6322 Unspecified lump in the left breast, upper inner quadrant: Secondary | ICD-10-CM

## 2023-08-23 ENCOUNTER — Ambulatory Visit
Admission: RE | Admit: 2023-08-23 | Discharge: 2023-08-23 | Disposition: A | Discharge status: Home / Self Care | Payer: Commercial Managed Care - PPO | Source: Ambulatory Visit | Attending: Obstetrics and Gynecology

## 2023-08-23 ENCOUNTER — Ambulatory Visit
Admission: RE | Admit: 2023-08-23 | Discharge: 2023-08-23 | Disposition: A | Discharge status: Home / Self Care | Payer: BLUE CROSS/BLUE SHIELD | Source: Ambulatory Visit | Attending: Obstetrics and Gynecology | Admitting: Obstetrics and Gynecology

## 2023-08-23 DIAGNOSIS — N6322 Unspecified lump in the left breast, upper inner quadrant: Secondary | ICD-10-CM

## 2023-09-21 ENCOUNTER — Ambulatory Visit: Payer: Commercial Managed Care - PPO | Admitting: Adult Health

## 2023-09-21 ENCOUNTER — Encounter: Payer: Self-pay | Admitting: Adult Health

## 2023-09-21 DIAGNOSIS — F331 Major depressive disorder, recurrent, moderate: Secondary | ICD-10-CM

## 2023-09-21 DIAGNOSIS — G4700 Insomnia, unspecified: Secondary | ICD-10-CM

## 2023-09-21 DIAGNOSIS — F411 Generalized anxiety disorder: Secondary | ICD-10-CM

## 2023-09-21 MED ORDER — SERTRALINE HCL 50 MG PO TABS: 50.0000 mg | ORAL_TABLET | Freq: Every day | ORAL | 3 refills | Status: DC

## 2023-09-21 MED ORDER — CLONAZEPAM 0.5 MG PO TABS: 0.5000 mg | ORAL_TABLET | Freq: Two times a day (BID) | ORAL | 2 refills | Status: DC | PRN

## 2023-09-21 NOTE — Progress Notes (Signed)
Pamela Foley [id number] December 05, 1965 57 y.o.  Subjective:   Patient ID:  Pamela Foley is a 57 y.o. (DOB 07/15/1966) female.  Chief Complaint: No chief complaint on file.   HPI Pamela Foley presents to the office today for follow-up of GAD, MDD and insomnia.  Describes mood today as "ok". Pleasant. Reports tearfulness. Mood symptoms - reports depression and anxiety. Reports irritability at times. Denies panic attacks. Reports worry, rumination, and over thinking. Mood is consistent. Stating "I feel like I'm doing ok". Currently taking Zoloft 50mg  daily and Clonazepam 0.5mg  twice daily as needed. Stable interest and motivation. Taking medications as prescribed.  Energy levels mostly stable. Active, has a regular exercise routine 3 days a week. Enjoys some usual interests and activities. Married. Lives with husband. Has 2 - English bulldogs. Two children from another marriage. Spending time with family. Appetite adequate. Weight loss - 30 pounds. Sleeps better some nights than others. Averages 4 to 5 hours since running out of Clonazepam a weeks ago. Focus and concentration stable. Completing tasks. Managing aspects of household. Works full-time - Biomedical engineer. Denies SI or HI.  Denies AH or VH. Denies self harm. Denies substance use.  Previous medication trials: Lunesta, Pristiq, Zoloft, Ambien, Clonazepam, Trazadone   Review of Systems:  Review of Systems  Musculoskeletal:  Negative for gait problem.  Neurological:  Negative for tremors.  Psychiatric/Behavioral:         Please refer to HPI    Medications: I have reviewed the patient's current medications.  Current Outpatient Medications  Medication Sig Dispense Refill   acyclovir (ZOVIRAX) 400 MG tablet Take 400 mg by mouth every 4 (four) hours while awake.     cetirizine (ZYRTEC) 10 MG tablet Take 10 mg by mouth daily.     clonazePAM (KLONOPIN) 0.5 MG tablet Take 1 tablet (0.5 mg total) by mouth 2 (two) times daily as  needed for anxiety. 60 tablet 2   fluticasone (FLONASE) 50 MCG/ACT nasal spray Place 2 sprays into the nose daily.     ibuprofen (ADVIL,MOTRIN) 200 MG tablet Take 200 mg by mouth every 6 (six) hours as needed. For headache     Polyethyl Glycol-Propyl Glycol (SYSTANE OP) Apply 1 drop to eye daily as needed. For dry eyes     sertraline (ZOLOFT) 50 MG tablet Take 1 tablet (50 mg total) by mouth daily. 90 tablet 3   valACYclovir (VALTREX) 500 MG tablet Take 500 mg by mouth 2 (two) times daily.     No current facility-administered medications for this visit.    Medication Side Effects: None  Allergies:  Allergies  Allergen Reactions   Morphine Nausea And Vomiting and Nausea Only   Morphine And Codeine Nausea And Vomiting and Nausea Only   Desvenlafaxine Nausea Only    Made symptoms worse Made symptoms worse Made symptoms worse Made symptoms worse    Eszopiclone Nausea Only    Made symptoms worse Made symptoms worse Made symptoms worse Made symptoms worse    Moxifloxacin Nausea And Vomiting    Past Medical History:  Diagnosis Date   Anxiety 02/13/2012   Herpes    HSV (herpes simplex virus) anogenital infection 02/13/2012   Obesity    Seasonal allergies 02/13/2012    Past Medical History, Surgical history, Social history, and Family history were reviewed and updated as appropriate.   Please see review of systems for further details on the patient's review from today.   Objective:   Physical Exam:  There were no vitals taken for  this visit.  Physical Exam Constitutional:      General: She is not in acute distress. Musculoskeletal:        General: No deformity.  Neurological:     Mental Status: She is alert and oriented to person, place, and time.     Coordination: Coordination normal.  Psychiatric:        Attention and Perception: Attention and perception normal. She does not perceive auditory or visual hallucinations.        Mood and Affect: Affect is not labile,  blunt, angry or inappropriate.        Speech: Speech normal.        Behavior: Behavior normal.        Thought Content: Thought content normal. Thought content is not paranoid or delusional. Thought content does not include homicidal or suicidal ideation. Thought content does not include homicidal or suicidal plan.        Cognition and Memory: Cognition and memory normal.        Judgment: Judgment normal.     Comments: Insight intact     Lab Review:     Component Value Date/Time   NA 138 02/14/2012 0334   K 4.4 02/14/2012 0334   CL 104 02/14/2012 0334   CO2 26 02/14/2012 0334   GLUCOSE 106 (H) 02/14/2012 0334   BUN 10 02/14/2012 0334   CREATININE 0.74 02/14/2012 0334   CALCIUM 8.7 02/14/2012 0334   PROT 6.8 02/13/2012 1252   ALBUMIN 3.4 (L) 02/13/2012 1252   AST 20 02/13/2012 1252   ALT 20 02/13/2012 1252   ALKPHOS 74 02/13/2012 1252   BILITOT 0.5 02/13/2012 1252   GFRNONAA >90 02/14/2012 0334   GFRAA >90 02/14/2012 0334       Component Value Date/Time   WBC 5.2 02/14/2012 0334   RBC 4.06 02/14/2012 0334   HGB 13.5 02/14/2012 0334   HCT 38.4 02/14/2012 0334   PLT 215 02/14/2012 0334   MCV 94.6 02/14/2012 0334   MCH 33.3 02/14/2012 0334   MCHC 35.2 02/14/2012 0334   RDW 13.4 02/14/2012 0334   LYMPHSABS 0.6 (L) 02/13/2012 0619   MONOABS 0.8 02/13/2012 0619   EOSABS 0.1 02/13/2012 0619   BASOSABS 0.0 02/13/2012 0619    No results found for: "POCLITH", "LITHIUM"   No results found for: "PHENYTOIN", "PHENOBARB", "VALPROATE", "CBMZ"   .res Assessment: Plan:    Plan:  PDMP reviewed  Zoloft 50mg  daily. Clonazepam 0.5mg  twice daily as needed.  Discussed potential benefits, risk, and side effects of benzodiazepines to include potential risk of tolerance and dependence, as well as possible drowsiness.  Advised patient not to drive if experiencing drowsiness and to take lowest possible effective dose to minimize risk of dependence and tolerance.   RTC 4  weeks  Patient advised to contact office with any questions, adverse effects, or acute worsening in signs and symptoms.   There are no diagnoses linked to this encounter.   Please see After Visit Summary for patient specific instructions.  Future Appointments  Date Time Provider Department Center  09/21/2023 11:20 AM Heleena Miceli, Thereasa Solo, NP [facility] None    No orders of the defined types were placed in this encounter.   -------------------------------

## 2023-11-13 ENCOUNTER — Telehealth: Payer: Self-pay | Admitting: Adult Health

## 2023-11-13 NOTE — Telephone Encounter (Signed)
She reports that depression is off the charts, its worse than it has been. Over the last several weeks she has a lot going on- "not a happy a camper". She would like an increase of Zoloft.   She also reports that the klonopin 1 at bedtime  is working great for sleep.

## 2023-11-13 NOTE — Telephone Encounter (Signed)
Pt called and LM . She would like to discuss increasing the dose of Zoloft that she takes.

## 2023-11-13 NOTE — Telephone Encounter (Signed)
LVM with details.

## 2024-03-13 ENCOUNTER — Other Ambulatory Visit: Payer: Self-pay | Admitting: Adult Health

## 2024-03-13 DIAGNOSIS — G4700 Insomnia, unspecified: Secondary | ICD-10-CM

## 2024-03-13 NOTE — Telephone Encounter (Signed)
 Please call to schedule FU. She is due this month. Needs appt for clonazepam.

## 2024-03-18 ENCOUNTER — Telehealth: Payer: Self-pay | Admitting: Adult Health

## 2024-03-18 NOTE — Telephone Encounter (Signed)
 RF handled thru pharmacy interface.

## 2024-03-18 NOTE — Telephone Encounter (Signed)
 Next visit is 04/18/24. Requesting refill on Clonazepam  called to:   [id number] - [place], [place] - [address] AT [facility] [address]    Phone: [phone]  Fax: [phone]

## 2024-04-18 ENCOUNTER — Ambulatory Visit (INDEPENDENT_AMBULATORY_CARE_PROVIDER_SITE_OTHER): Admitting: Adult Health

## 2024-04-18 ENCOUNTER — Encounter: Payer: Self-pay | Admitting: Adult Health

## 2024-04-18 DIAGNOSIS — F411 Generalized anxiety disorder: Secondary | ICD-10-CM | POA: Diagnosis not present

## 2024-04-18 DIAGNOSIS — F331 Major depressive disorder, recurrent, moderate: Secondary | ICD-10-CM

## 2024-04-18 DIAGNOSIS — G4700 Insomnia, unspecified: Secondary | ICD-10-CM | POA: Diagnosis not present

## 2024-04-18 DIAGNOSIS — F29 Unspecified psychosis not due to a substance or known physiological condition: Secondary | ICD-10-CM

## 2024-04-18 MED ORDER — CLONAZEPAM 0.5 MG PO TABS: 0.5000 mg | ORAL_TABLET | Freq: Two times a day (BID) | ORAL | 2 refills | Status: DC | PRN

## 2024-04-18 MED ORDER — SERTRALINE HCL 50 MG PO TABS: 50.0000 mg | ORAL_TABLET | Freq: Every day | ORAL | 3 refills | Status: DC

## 2024-04-18 NOTE — Progress Notes (Addendum)
 Pamela Foley [id number] 02-05-1966 58 y.o.  Virtual Visit via Video Note  I connected with pt @ on 04/18/24 at  1:00 PM EDT by a video enabled telemedicine application and verified that I am speaking with the correct person using two identifiers.   I discussed the limitations of evaluation and management by telemedicine and the availability of in person appointments. The patient expressed understanding and agreed to proceed.  I discussed the assessment and treatment plan with the patient. The patient was provided an opportunity to ask questions and all were answered. The patient agreed with the plan and demonstrated an understanding of the instructions.   The patient was advised to call back or seek an in-person evaluation if the symptoms worsen or if the condition fails to improve as anticipated.  I provided 15 minutes of non-face-to-face time during this encounter.  The patient was located at home.  The provider was located at [facility].   Reagan Camera, NP   Subjective:   Patient ID:  Pamela Foley is a 58 y.o. (DOB July 29, 1966) female.  Chief Complaint: No chief complaint on file.   HPI Pamela Foley presents for follow-up of GAD, MDD and insomnia.  Describes mood today as "ok". Pleasant. Reports tearfulness. Mood symptoms - reports some depression and anxiety - "more situational". Reports stable interest and motivation. Reports irritability at times - "short tempered". Denies panic attacks. Reports worry, rumination, and over thinking. Reports mood as stable. Stating "I feel like I'm doing ok". Currently taking Zoloft  50mg  daily and Clonazepam  0.5mg  twice daily as needed. Taking medications as prescribed.  Energy levels mostly stable. Active, has a regular exercise routine 3 days a week. Enjoys some usual interests and activities. Married. Lives with husband. Has 2 - English bulldogs. Two children from another marriage. Spending time with family. Appetite  adequate. Weight loss - 37 pounds. Sleeps better some nights than others. Averages 6 to 7 hours. Focus and concentration stable. Completing tasks. Managing aspects of household. Works full-time - Biomedical engineer. Denies SI or HI.  Denies AH or VH. Denies self harm. Denies substance use.  Previous medication trials: Lunesta, Pristiq, Zoloft , Ambien, Clonazepam , Trazadone  Review of Systems:  Review of Systems  Musculoskeletal:  Negative for gait problem.  Neurological:  Negative for tremors.  Psychiatric/Behavioral:         Please refer to HPI    Medications: I have reviewed the patient's current medications.  Current Outpatient Medications  Medication Sig Dispense Refill   acyclovir  (ZOVIRAX ) 400 MG tablet Take 400 mg by mouth every 4 (four) hours while awake.     cetirizine (ZYRTEC) 10 MG tablet Take 10 mg by mouth daily.     clonazePAM  (KLONOPIN ) 0.5 MG tablet TAKE 1 TABLET(0.5 MG) BY MOUTH TWICE DAILY AS NEEDED FOR ANXIETY 60 tablet 0   fluticasone  (FLONASE ) 50 MCG/ACT nasal spray Place 2 sprays into the nose daily.     ibuprofen (ADVIL,MOTRIN) 200 MG tablet Take 200 mg by mouth every 6 (six) hours as needed. For headache     Polyethyl Glycol-Propyl Glycol (SYSTANE OP) Apply 1 drop to eye daily as needed. For dry eyes     sertraline  (ZOLOFT ) 50 MG tablet Take 1 tablet (50 mg total) by mouth daily. 90 tablet 3   valACYclovir (VALTREX) 500 MG tablet Take 500 mg by mouth 2 (two) times daily.     No current facility-administered medications for this visit.    Medication Side Effects: None  Allergies:  Allergies  Allergen Reactions   Morphine Nausea And Vomiting and Nausea Only   Morphine And Codeine Nausea And Vomiting and Nausea Only   Desvenlafaxine Nausea Only    Made symptoms worse Made symptoms worse Made symptoms worse Made symptoms worse    Eszopiclone Nausea Only    Made symptoms worse Made symptoms worse Made symptoms worse Made symptoms worse     Moxifloxacin Nausea And Vomiting    Past Medical History:  Diagnosis Date   Anxiety 02/13/2012   Herpes    HSV (herpes simplex virus) anogenital infection 02/13/2012   Obesity    Seasonal allergies 02/13/2012    Family History  Problem Relation Age of Onset   Heart attack Father     Social History   Socioeconomic History   Marital status: Married    Spouse name: Not on file   Number of children: Not on file   Years of education: Not on file   Highest education level: Not on file  Occupational History   Not on file  Tobacco Use   Smoking status: Never   Smokeless tobacco: Not on file  Substance and Sexual Activity   Alcohol  use: Yes    Comment: occassionally   Drug use: No   Sexual activity: Yes  Other Topics Concern   Not on file  Social History Narrative   Not on file   Social Drivers of Health   Financial Resource Strain: Not on file  Food Insecurity: Not on file  Transportation Needs: Not on file  Physical Activity: Not on file  Stress: Not on file  Social Connections: Not on file  Intimate Partner Violence: Not on file    Past Medical History, Surgical history, Social history, and Family history were reviewed and updated as appropriate.   Please see review of systems for further details on the patient's review from today.   Objective:   Physical Exam:  There were no vitals taken for this visit.  Physical Exam Constitutional:      General: She is not in acute distress. Musculoskeletal:        General: No deformity.  Neurological:     Mental Status: She is alert and oriented to person, place, and time.     Coordination: Coordination normal.  Psychiatric:        Attention and Perception: Attention and perception normal. She does not perceive auditory or visual hallucinations.        Mood and Affect: Mood normal. Mood is not anxious or depressed. Affect is not labile, blunt, angry or inappropriate.        Speech: Speech normal.        Behavior:  Behavior normal.        Thought Content: Thought content normal. Thought content is not paranoid or delusional. Thought content does not include homicidal or suicidal ideation. Thought content does not include homicidal or suicidal plan.        Cognition and Memory: Cognition and memory normal.        Judgment: Judgment normal.     Comments: Insight intact     Lab Review:     Component Value Date/Time   NA 138 02/14/2012 0334   K 4.4 02/14/2012 0334   CL 104 02/14/2012 0334   CO2 26 02/14/2012 0334   GLUCOSE 106 (H) 02/14/2012 0334   BUN 10 02/14/2012 0334   CREATININE 0.74 02/14/2012 0334   CALCIUM 8.7 02/14/2012 0334   PROT 6.8 02/13/2012 1252   ALBUMIN 3.4 (L)  02/13/2012 1252   AST 20 02/13/2012 1252   ALT 20 02/13/2012 1252   ALKPHOS 74 02/13/2012 1252   BILITOT 0.5 02/13/2012 1252   GFRNONAA >90 02/14/2012 0334   GFRAA >90 02/14/2012 0334       Component Value Date/Time   WBC 5.2 02/14/2012 0334   RBC 4.06 02/14/2012 0334   HGB 13.5 02/14/2012 0334   HCT 38.4 02/14/2012 0334   PLT 215 02/14/2012 0334   MCV 94.6 02/14/2012 0334   MCH 33.3 02/14/2012 0334   MCHC 35.2 02/14/2012 0334   RDW 13.4 02/14/2012 0334   LYMPHSABS 0.6 (L) 02/13/2012 0619   MONOABS 0.8 02/13/2012 0619   EOSABS 0.1 02/13/2012 0619   BASOSABS 0.0 02/13/2012 0619    No results found for: "POCLITH", "LITHIUM"   No results found for: "PHENYTOIN", "PHENOBARB", "VALPROATE", "CBMZ"   .res Assessment: Plan:    Plan:  PDMP reviewed  Zoloft  50mg  daily. Clonazepam  0.5mg  twice daily as needed - taking once daily.  15 minutes spent dedicated to the care of this patient on the date of this encounter to include pre-visit review of records, ordering of medication, post visit documentation, and face-to-face time with the patient discussing depression, anxiety and insomnia. Discussed continuing current medication regimen.  Discussed potential benefits, risk, and side effects of benzodiazepines to  include potential risk of tolerance and dependence, as well as possible drowsiness.  Advised patient not to drive if experiencing drowsiness and to take lowest possible effective dose to minimize risk of dependence and tolerance.   RTC 6 months  Patient advised to contact office with any questions, adverse effects, or acute worsening in signs and symptoms.   There are no diagnoses linked to this encounter.   Please see After Visit Summary for patient specific instructions.  Future Appointments  Date Time Provider Department Center  04/18/2024  1:00 PM Veda Arrellano Nattalie, NP CP-[facility] None    No orders of the defined types were placed in this encounter.     -------------------------------

## 2024-07-04 ENCOUNTER — Other Ambulatory Visit (HOSPITAL_COMMUNITY): Payer: Self-pay | Admitting: Neurosurgery

## 2024-07-04 ENCOUNTER — Telehealth (HOSPITAL_COMMUNITY): Payer: Self-pay | Admitting: Radiology

## 2024-07-04 DIAGNOSIS — H93A2 Pulsatile tinnitus, left ear: Secondary | ICD-10-CM

## 2024-07-04 NOTE — Telephone Encounter (Signed)
 Called pt to scheduled procedure with Dr. Janjua. Left VM for her to call me back. JM

## 2024-10-17 ENCOUNTER — Encounter: Payer: Self-pay | Admitting: Adult Health

## 2024-10-17 ENCOUNTER — Ambulatory Visit: Admitting: Adult Health

## 2024-10-17 DIAGNOSIS — F411 Generalized anxiety disorder: Secondary | ICD-10-CM

## 2024-10-17 DIAGNOSIS — F331 Major depressive disorder, recurrent, moderate: Secondary | ICD-10-CM

## 2024-10-17 DIAGNOSIS — G4700 Insomnia, unspecified: Secondary | ICD-10-CM

## 2024-10-17 MED ORDER — SERTRALINE HCL 100 MG PO TABS: 100.0000 mg | ORAL_TABLET | Freq: Every day | ORAL | 5 refills | Status: AC

## 2024-10-17 MED ORDER — CLONAZEPAM 0.5 MG PO TABS: 0.5000 mg | ORAL_TABLET | Freq: Two times a day (BID) | ORAL | 2 refills | Status: AC | PRN

## 2024-10-17 NOTE — Progress Notes (Signed)
 Pamela Foley [id number] [birth year] 58 y.o.  Subjective:   Patient ID:  Pamela Foley is a 58 y.o. (DOB [birth year]) female.  Chief Complaint: No chief complaint on file.   HPI Pamela Foley presents to the office today for follow-up of GAD, MDD and insomnia.  Describes mood today as ok. Pleasant. Reports tearfulness. Mood symptoms - reports some depression and anxiety - more situational. Reports stable interest and motivation. Reports irritability at times. Denies panic attacks - but reports periods of high anxiety. Reports worry, rumination, and over thinking. Reports mood is stable. Stating I feel like I'm doing alright. Currently taking Zoloft  100mg  daily and Clonazepam  0.5mg  twice daily as needed. Taking medications as prescribed.  Energy levels mostly stable. Active, has a regular exercise routine. Enjoys some usual interests and activities. Married. Lives with husband. Has 2 - English bulldogs. Two children from another marriage. Spending time with family. Appetite adequate. Weight loss - 40 pounds. Sleeps better some nights than others. Averages 6 to 7 hours. Focus and concentration stable. Completing tasks. Managing aspects of household. Works full-time - biomedical engineer. Denies SI or HI.  Denies AH or VH. Denies self harm. Denies substance use.  Previous medication trials: Lunesta, Pristiq, Zoloft , Ambien, Clonazepam , Trazadone      Review of Systems:  Review of Systems  Musculoskeletal:  Negative for gait problem.  Neurological:  Negative for tremors.  Psychiatric/Behavioral:         Please refer to HPI    Medications: I have reviewed the patient's current medications.  Current Outpatient Medications  Medication Sig Dispense Refill  . acyclovir  (ZOVIRAX ) 400 MG tablet Take 400 mg by mouth every 4 (four) hours while awake.    . cetirizine (ZYRTEC) 10 MG tablet Take 10 mg by mouth daily.    . clonazePAM  (KLONOPIN ) 0.5 MG tablet Take 1 tablet (0.5 mg  total) by mouth 2 (two) times daily as needed for anxiety. 60 tablet 2  . fluticasone  (FLONASE ) 50 MCG/ACT nasal spray Place 2 sprays into the nose daily.    SABRA ibuprofen (ADVIL,MOTRIN) 200 MG tablet Take 200 mg by mouth every 6 (six) hours as needed. For headache    . Polyethyl Glycol-Propyl Glycol (SYSTANE OP) Apply 1 drop to eye daily as needed. For dry eyes    . sertraline  (ZOLOFT ) 50 MG tablet Take 1 tablet (50 mg total) by mouth daily. 90 tablet 3  . valACYclovir (VALTREX) 500 MG tablet Take 500 mg by mouth 2 (two) times daily.     No current facility-administered medications for this visit.    Medication Side Effects: None  Allergies:  Allergies  Allergen Reactions  . Morphine Nausea And Vomiting and Nausea Only  . Morphine And Codeine Nausea And Vomiting and Nausea Only  . Desvenlafaxine Nausea Only    Made symptoms worse Made symptoms worse Made symptoms worse Made symptoms worse   . Eszopiclone Nausea Only    Made symptoms worse Made symptoms worse Made symptoms worse Made symptoms worse   . Moxifloxacin Nausea And Vomiting    Past Medical History:  Diagnosis Date  . Anxiety 02/13/2012  . Herpes   . HSV (herpes simplex virus) anogenital infection 02/13/2012  . Obesity   . Seasonal allergies 02/13/2012    Past Medical History, Surgical history, Social history, and Family history were reviewed and updated as appropriate.   Please see review of systems for further details on the patient's review from today.   Objective:   Physical Exam:  There were  no vitals taken for this visit.  Physical Exam Constitutional:      General: She is not in acute distress. Musculoskeletal:        General: No deformity.  Neurological:     Mental Status: She is alert and oriented to person, place, and time.     Coordination: Coordination normal.  Psychiatric:        Attention and Perception: Attention and perception normal. She does not perceive auditory or visual  hallucinations.        Mood and Affect: Mood normal. Mood is not anxious or depressed. Affect is not labile, blunt, angry or inappropriate.        Speech: Speech normal.        Behavior: Behavior normal.        Thought Content: Thought content normal. Thought content is not paranoid or delusional. Thought content does not include homicidal or suicidal ideation. Thought content does not include homicidal or suicidal plan.        Cognition and Memory: Cognition and memory normal.        Judgment: Judgment normal.     Comments: Insight intact     Lab Review:     Component Value Date/Time   NA 138 02/14/2012 0334   K 4.4 02/14/2012 0334   CL 104 02/14/2012 0334   CO2 26 02/14/2012 0334   GLUCOSE 106 (H) 02/14/2012 0334   BUN 10 02/14/2012 0334   CREATININE 0.74 02/14/2012 0334   CALCIUM 8.7 02/14/2012 0334   PROT 6.8 02/13/2012 1252   ALBUMIN 3.4 (L) 02/13/2012 1252   AST 20 02/13/2012 1252   ALT 20 02/13/2012 1252   ALKPHOS 74 02/13/2012 1252   BILITOT 0.5 02/13/2012 1252   GFRNONAA >90 02/14/2012 0334   GFRAA >90 02/14/2012 0334       Component Value Date/Time   WBC 5.2 02/14/2012 0334   RBC 4.06 02/14/2012 0334   HGB 13.5 02/14/2012 0334   HCT 38.4 02/14/2012 0334   PLT 215 02/14/2012 0334   MCV 94.6 02/14/2012 0334   MCH 33.3 02/14/2012 0334   MCHC 35.2 02/14/2012 0334   RDW 13.4 02/14/2012 0334   LYMPHSABS 0.6 (L) 02/13/2012 0619   MONOABS 0.8 02/13/2012 0619   EOSABS 0.1 02/13/2012 0619   BASOSABS 0.0 02/13/2012 0619    No results found for: POCLITH, LITHIUM   No results found for: PHENYTOIN, PHENOBARB, VALPROATE, CBMZ   .res Assessment: Plan:    Plan:  PDMP reviewed  Zoloft  100mg  daily. Clonazepam  0.5mg  twice daily as needed - taking once daily.  15 minutes spent dedicated to the care of this patient on the date of this encounter to include pre-visit review of records, ordering of medication, post visit documentation, and face-to-face time  with the patient discussing depression, anxiety and insomnia. Discussed continuing current medication regimen.  Discussed potential benefits, risk, and side effects of benzodiazepines to include potential risk of tolerance and dependence, as well as possible drowsiness. Advised patient not to drive if experiencing drowsiness and to take lowest possible effective dose to minimize risk of dependence and tolerance.   RTC 6 months  Patient advised to contact office with any questions, adverse effects, or acute worsening in signs and symptoms.  There are no diagnoses linked to this encounter.   Please see After Visit Summary for patient specific instructions.  No future appointments.  No orders of the defined types were placed in this encounter.   -------------------------------

## 2025-04-16 ENCOUNTER — Ambulatory Visit: Admitting: Adult Health
